# Patient Record
Sex: Male | Born: 1980 | Race: Black or African American | Hispanic: No | Marital: Single | Smoking: Current every day smoker
Health system: Southern US, Community
[De-identification: ages and names within clinical notes are randomized; demographics above are authoritative.]

## PROBLEM LIST (undated history)

## (undated) DIAGNOSIS — I517 Cardiomegaly: Secondary | ICD-10-CM

## (undated) DIAGNOSIS — I82409 Acute embolism and thrombosis of unspecified deep veins of unspecified lower extremity: Secondary | ICD-10-CM

## (undated) DIAGNOSIS — R4020 Unspecified coma: Secondary | ICD-10-CM

## (undated) HISTORY — DX: Unspecified coma: R40.20

---

## 2008-01-03 ENCOUNTER — Emergency Department (HOSPITAL_COMMUNITY): Admission: EM | Admit: 2008-01-03 | Discharge: 2008-01-03 | Payer: Self-pay | Admitting: Emergency Medicine

## 2009-01-08 ENCOUNTER — Emergency Department (HOSPITAL_COMMUNITY): Admission: EM | Admit: 2009-01-08 | Discharge: 2009-01-08 | Payer: Self-pay | Admitting: Emergency Medicine

## 2009-02-15 ENCOUNTER — Ambulatory Visit: Payer: Self-pay | Admitting: Cardiology

## 2009-02-21 ENCOUNTER — Ambulatory Visit: Payer: Self-pay | Admitting: Cardiology

## 2009-02-21 ENCOUNTER — Encounter: Payer: Self-pay | Admitting: Cardiology

## 2009-02-21 ENCOUNTER — Ambulatory Visit (HOSPITAL_COMMUNITY): Admission: RE | Admit: 2009-02-21 | Discharge: 2009-02-21 | Payer: Self-pay | Admitting: Cardiology

## 2009-07-09 ENCOUNTER — Emergency Department (HOSPITAL_COMMUNITY): Admission: EM | Admit: 2009-07-09 | Discharge: 2009-07-09 | Payer: Self-pay | Admitting: Emergency Medicine

## 2011-02-14 LAB — CBC
Hemoglobin: 13.8 g/dL (ref 13.0–17.0)
RDW: 12.2 % (ref 11.5–15.5)

## 2011-02-14 LAB — BASIC METABOLIC PANEL
Calcium: 9.8 mg/dL (ref 8.4–10.5)
GFR calc Af Amer: >60 mL/min (ref >60)
GFR calc non Af Amer: >60 mL/min (ref >60)
Glucose, Bld: 98 mg/dL (ref 70–99)
Sodium: 139 mEq/L (ref 135–145)

## 2011-02-14 LAB — DIFFERENTIAL
Basophils Absolute: 0 10*3/uL (ref 0.0–0.1)
Lymphocytes Relative: 11 % — ABNORMAL LOW (ref 12–46)
Monocytes Absolute: 0.9 10*3/uL (ref 0.1–1.0)
Neutro Abs: 7 10*3/uL (ref 1.7–7.7)
Neutrophils Relative %: 77 % (ref 43–77)

## 2011-02-14 LAB — D-DIMER, QUANTITATIVE: D-Dimer, Quant: 0.22 ug/mL-FEU (ref 0.00–0.48)

## 2011-02-14 LAB — POCT CARDIAC MARKERS
CKMB, poc: <1 ng/mL — ABNORMAL LOW (ref 1.0–8.0)
Myoglobin, poc: 61.4 ng/mL (ref 12–200)
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
## 2011-03-19 NOTE — Procedures (Signed)
[facility]                              EXERCISE TREADMILL   NAME:DAVISDewel, Lotter                          MRN:          [id number]  DATE:02/21/2009                            DOB:          [birth year]    Graded exercise test.   CLINICAL DATA:  A 30 year old gentleman admitted to hospital with chest  pain in the setting of cocaine use.  1. Graded exercise performed to a workload of 8.5 METS and a heart      rate of 164, 85% of age-predicted maximum.  Exercise discontinued      due to dyspnea and fatigue; no chest pain reported.  2. Blood pressure increased from a resting value of 130/88 to 180/90      early in recovery, a normal response.  3. No arrhythmias noted.  4. EKG:  Normal sinus rhythm; borderline left atrial abnormality;      right bundle-branch block.  5. Stress EKG:  No significant change.   IMPRESSION:  Negative graded exercise test.  Mr. Urwin achieved an  adequate work load and heart rate, but had neither symptoms nor  electrocardiographic abnormalities suggesting myocardial ischemia.  Other findings as noted.     Gerrit Friends. Dietrich Pates, MD, [facility]  Electronically Signed    RMR/MedQ  DD: 02/21/2009  DT: 02/22/2009  Job #: [id number]

## 2011-10-21 ENCOUNTER — Encounter: Payer: Self-pay | Admitting: Cardiology

## 2013-10-12 ENCOUNTER — Emergency Department (HOSPITAL_COMMUNITY)
Admission: EM | Admit: 2013-10-12 | Discharge: 2013-10-12 | Disposition: A | Discharge status: Home / Self Care | Payer: PRIVATE HEALTH INSURANCE | Attending: Emergency Medicine | Admitting: Emergency Medicine

## 2013-10-12 ENCOUNTER — Encounter (HOSPITAL_COMMUNITY): Payer: Self-pay | Admitting: Emergency Medicine

## 2013-10-12 ENCOUNTER — Emergency Department (HOSPITAL_COMMUNITY): Payer: PRIVATE HEALTH INSURANCE

## 2013-10-12 DIAGNOSIS — Z8679 Personal history of other diseases of the circulatory system: Secondary | ICD-10-CM | POA: Insufficient documentation

## 2013-10-12 DIAGNOSIS — R0789 Other chest pain: Secondary | ICD-10-CM | POA: Insufficient documentation

## 2013-10-12 DIAGNOSIS — R079 Chest pain, unspecified: Secondary | ICD-10-CM

## 2013-10-12 DIAGNOSIS — F17200 Nicotine dependence, unspecified, uncomplicated: Secondary | ICD-10-CM | POA: Insufficient documentation

## 2013-10-12 DIAGNOSIS — Z7901 Long term (current) use of anticoagulants: Secondary | ICD-10-CM | POA: Insufficient documentation

## 2013-10-12 DIAGNOSIS — Z86718 Personal history of other venous thrombosis and embolism: Secondary | ICD-10-CM | POA: Insufficient documentation

## 2013-10-12 DIAGNOSIS — Z791 Long term (current) use of non-steroidal anti-inflammatories (NSAID): Secondary | ICD-10-CM | POA: Insufficient documentation

## 2013-10-12 DIAGNOSIS — Z86711 Personal history of pulmonary embolism: Secondary | ICD-10-CM | POA: Insufficient documentation

## 2013-10-12 HISTORY — DX: Cardiomegaly: I51.7

## 2013-10-12 HISTORY — DX: Acute embolism and thrombosis of unspecified deep veins of unspecified lower extremity: I82.409

## 2013-10-12 LAB — POCT I-STAT, CHEM 8
Calcium, Ion: 1.26 mmol/L — ABNORMAL HIGH (ref 1.12–1.23)
HCT: 40 % (ref 39.0–52.0)
Hemoglobin: 13.6 g/dL (ref 13.0–17.0)
Sodium: 145 mEq/L (ref 135–145)
TCO2: 25 mmol/L (ref 0–100)

## 2013-10-12 LAB — CBC
MCH: 31.5 pg (ref 26.0–34.0)
MCHC: 34.1 g/dL (ref 30.0–36.0)
Platelets: 206 10*3/uL (ref 150–400)
RBC: 4.1 MIL/uL — ABNORMAL LOW (ref 4.22–5.81)
RDW: 12.4 % (ref 11.5–15.5)

## 2013-10-12 MED ORDER — NAPROXEN 500 MG PO TABS: 500.0000 mg | ORAL_TABLET | Freq: Two times a day (BID) | ORAL | Status: DC

## 2013-10-12 MED ORDER — IOHEXOL 350 MG/ML SOLN
100.0000 mL | Freq: Once | INTRAVENOUS | Status: AC | PRN
  Administered 2013-10-12: 100 mL via INTRAVENOUS
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
## 2013-10-12 NOTE — ED Notes (Signed)
Pt c/o left chest pain intermittently since last Tuesday.

## 2013-11-04 ENCOUNTER — Emergency Department (HOSPITAL_COMMUNITY)
Admission: EM | Admit: 2013-11-04 | Discharge: 2013-11-05 | Disposition: A | Discharge status: Home / Self Care | Payer: No Typology Code available for payment source | Attending: Emergency Medicine | Admitting: Emergency Medicine

## 2013-11-04 ENCOUNTER — Encounter (HOSPITAL_COMMUNITY): Payer: Self-pay | Admitting: Emergency Medicine

## 2013-11-04 DIAGNOSIS — Z7902 Long term (current) use of antithrombotics/antiplatelets: Secondary | ICD-10-CM | POA: Insufficient documentation

## 2013-11-04 DIAGNOSIS — M7989 Other specified soft tissue disorders: Secondary | ICD-10-CM | POA: Insufficient documentation

## 2013-11-04 DIAGNOSIS — Z791 Long term (current) use of non-steroidal anti-inflammatories (NSAID): Secondary | ICD-10-CM | POA: Insufficient documentation

## 2013-11-04 DIAGNOSIS — Z8679 Personal history of other diseases of the circulatory system: Secondary | ICD-10-CM | POA: Insufficient documentation

## 2013-11-04 DIAGNOSIS — Z79899 Other long term (current) drug therapy: Secondary | ICD-10-CM | POA: Insufficient documentation

## 2013-11-04 DIAGNOSIS — Z86718 Personal history of other venous thrombosis and embolism: Secondary | ICD-10-CM | POA: Insufficient documentation

## 2013-11-04 DIAGNOSIS — Z87828 Personal history of other (healed) physical injury and trauma: Secondary | ICD-10-CM | POA: Insufficient documentation

## 2013-11-04 DIAGNOSIS — F17200 Nicotine dependence, unspecified, uncomplicated: Secondary | ICD-10-CM | POA: Insufficient documentation

## 2013-11-04 MED ORDER — KETOROLAC TROMETHAMINE 60 MG/2ML IM SOLN
60.0000 mg | Freq: Once | INTRAMUSCULAR | Status: AC
  Administered 2013-11-04: 60 mg via INTRAMUSCULAR
  Filled 2013-11-04: qty 2

## 2013-11-04 NOTE — Discharge Instructions (Signed)
Continue to take the Eliquis for the blood clots - if your ultrasound shows that you still have blood clots, the nurse at the [facility] will need to change you to a different blood thinner such as coumadin.  Return to the ER immediately for severe chest pain or difficulty breathing.  Please call your doctor for a followup appointment within 24-48 hours. When you talk to your doctor please let them know that you were seen in the emergency department and have them acquire all of your records so that they can discuss the findings with you and formulate a treatment plan to fully care for your new and ongoing problems.
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
## 2013-11-04 NOTE — ED Notes (Signed)
Pt states he is on Eliquis for dvt of left leg, states he has been having pain in the left lower leg for some time, worse recently.  Pt denies new injury. Obvious swelling noted to outer calf of lower leg.

## 2013-11-05 ENCOUNTER — Other Ambulatory Visit (HOSPITAL_COMMUNITY): Payer: Self-pay | Admitting: Emergency Medicine

## 2013-11-05 ENCOUNTER — Ambulatory Visit (HOSPITAL_COMMUNITY)
Admit: 2013-11-05 | Discharge: 2013-11-05 | Disposition: A | Discharge status: Home / Self Care | Payer: No Typology Code available for payment source | Source: Ambulatory Visit | Attending: Emergency Medicine | Admitting: Emergency Medicine

## 2013-11-05 DIAGNOSIS — M79605 Pain in left leg: Secondary | ICD-10-CM

## 2013-11-05 DIAGNOSIS — M7989 Other specified soft tissue disorders: Secondary | ICD-10-CM | POA: Insufficient documentation

## 2013-11-05 DIAGNOSIS — I824Y9 Acute embolism and thrombosis of unspecified deep veins of unspecified proximal lower extremity: Secondary | ICD-10-CM | POA: Insufficient documentation

## 2013-11-05 NOTE — ED Provider Notes (Signed)
Doppler study of left lower extremity reveals a nonocclusive clot in the femoral vein. Patient has had a persistent left lower extremity DVT since 1999.  Continue Eliquis 5 mg po bid.  [facility] with [facility].  Donnetta HutchingBrian Darby Shadwick, MD 11/05/13 [phone]

## 2015-06-13 ENCOUNTER — Encounter (HOSPITAL_COMMUNITY): Payer: Self-pay | Admitting: *Deleted

## 2015-06-13 ENCOUNTER — Emergency Department (HOSPITAL_COMMUNITY)
Admission: EM | Admit: 2015-06-13 | Discharge: 2015-06-13 | Disposition: A | Discharge status: Home / Self Care | Payer: Medicaid Other | Attending: Emergency Medicine | Admitting: Emergency Medicine

## 2015-06-13 DIAGNOSIS — Z720 Tobacco use: Secondary | ICD-10-CM | POA: Diagnosis not present

## 2015-06-13 DIAGNOSIS — Z79899 Other long term (current) drug therapy: Secondary | ICD-10-CM | POA: Insufficient documentation

## 2015-06-13 DIAGNOSIS — Z8679 Personal history of other diseases of the circulatory system: Secondary | ICD-10-CM | POA: Insufficient documentation

## 2015-06-13 DIAGNOSIS — Z791 Long term (current) use of non-steroidal anti-inflammatories (NSAID): Secondary | ICD-10-CM | POA: Insufficient documentation

## 2015-06-13 DIAGNOSIS — M62830 Muscle spasm of back: Secondary | ICD-10-CM | POA: Insufficient documentation

## 2015-06-13 DIAGNOSIS — M5440 Lumbago with sciatica, unspecified side: Secondary | ICD-10-CM | POA: Insufficient documentation

## 2015-06-13 DIAGNOSIS — Z86718 Personal history of other venous thrombosis and embolism: Secondary | ICD-10-CM | POA: Insufficient documentation

## 2015-06-13 DIAGNOSIS — M545 Low back pain: Secondary | ICD-10-CM | POA: Diagnosis present

## 2015-06-13 MED ORDER — METHOCARBAMOL 500 MG PO TABS: 500.0000 mg | ORAL_TABLET | Freq: Three times a day (TID) | ORAL | Status: DC

## 2015-06-13 MED ORDER — ACETAMINOPHEN-CODEINE #3 300-30 MG PO TABS: 1.0000 | ORAL_TABLET | Freq: Four times a day (QID) | ORAL | Status: DC | PRN

## 2015-06-13 MED ORDER — DEXAMETHASONE 4 MG PO TABS: 4.0000 mg | ORAL_TABLET | Freq: Two times a day (BID) | ORAL | Status: DC

## 2015-06-13 NOTE — Discharge Instructions (Signed)
Please rest your back is much as possible. Please alternate heat and ice to your lower back. Use Decadron and Robaxin daily. Use Tylenol codeine for more severe pain. Robaxin and Tylenol codeine may cause drowsiness, please use these medicines with caution. Please see Dr. August Saucer for evaluation concerning your lower back pain. Back Pain, Adult Back pain is very common. The pain often gets better over time. The cause of back pain is usually not dangerous. Most people can learn to manage their back pain on their own.  HOME CARE   Stay active. Start with short walks on flat ground if you can. Try to walk farther each day.  Do not sit, drive, or stand in one place for more than 30 minutes. Do not stay in bed.  Do not avoid exercise or work. Activity can help your back heal faster.  Be careful when you bend or lift an object. Bend at your knees, keep the object close to you, and do not twist.  Sleep on a firm mattress. Lie on your side, and bend your knees. If you lie on your back, put a pillow under your knees.  Only take medicines as told by your doctor.  Put ice on the injured area.  Put ice in a plastic bag.  Place a towel between your skin and the bag.  Leave the ice on for 15-20 minutes, 03-04 times a day for the first 2 to 3 days. After that, you can switch between ice and heat packs.  Ask your doctor about back exercises or massage.  Avoid feeling anxious or stressed. Find good ways to deal with stress, such as exercise. GET HELP RIGHT AWAY IF:   Your pain does not go away with rest or medicine.  Your pain does not go away in 1 week.  You have new problems.  You do not feel well.  The pain spreads into your legs.  You cannot control when you poop (bowel movement) or pee (urinate).  Your arms or legs feel weak or lose feeling (numbness).  You feel sick to your stomach (nauseous) or throw up (vomit).  You have belly (abdominal) pain.  You feel like you may pass out  (faint). MAKE SURE YOU:   Understand these instructions.  Will watch your condition.  Will get help right away if you are not doing well or get worse. Document Released: 04/08/2008 Document Revised: 01/13/2012 Document Reviewed: 02/22/2014 [facility] Patient Information 2015 [place], [place]. This information is not intended to replace advice given to you by your health care provider. Make sure you discuss any questions you have with your health care provider.
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
## 2015-06-13 NOTE — ED Notes (Signed)
Patient reports moving a dresser about 1 month ago and felt a "pop" in back. Reports pain in low back that radiates down right leg.

## 2015-07-25 ENCOUNTER — Other Ambulatory Visit: Payer: Self-pay | Admitting: Sports Medicine

## 2015-07-25 DIAGNOSIS — M545 Low back pain: Secondary | ICD-10-CM

## 2015-07-26 ENCOUNTER — Other Ambulatory Visit: Payer: Self-pay | Admitting: Sports Medicine

## 2015-07-26 DIAGNOSIS — Z139 Encounter for screening, unspecified: Secondary | ICD-10-CM

## 2015-08-07 ENCOUNTER — Ambulatory Visit
Admission: RE | Admit: 2015-08-07 | Discharge: 2015-08-07 | Disposition: A | Discharge status: Home / Self Care | Payer: Medicaid Other | Source: Ambulatory Visit | Attending: Sports Medicine | Admitting: Sports Medicine

## 2015-08-07 ENCOUNTER — Other Ambulatory Visit: Payer: PRIVATE HEALTH INSURANCE

## 2015-08-07 DIAGNOSIS — Z139 Encounter for screening, unspecified: Secondary | ICD-10-CM

## 2015-08-07 DIAGNOSIS — M545 Low back pain: Secondary | ICD-10-CM

## 2016-11-27 IMAGING — CR DG ORBITS FOR FOREIGN BODY
2 series · 2 of 2 positions shown · non-contrast
Comparison: None.

CLINICAL DATA: Metal working/exposure; clearance prior to MRI

EXAM:
ORBITS FOR FOREIGN BODY - 2 VIEW

[w orbit pa (1 of 2)]
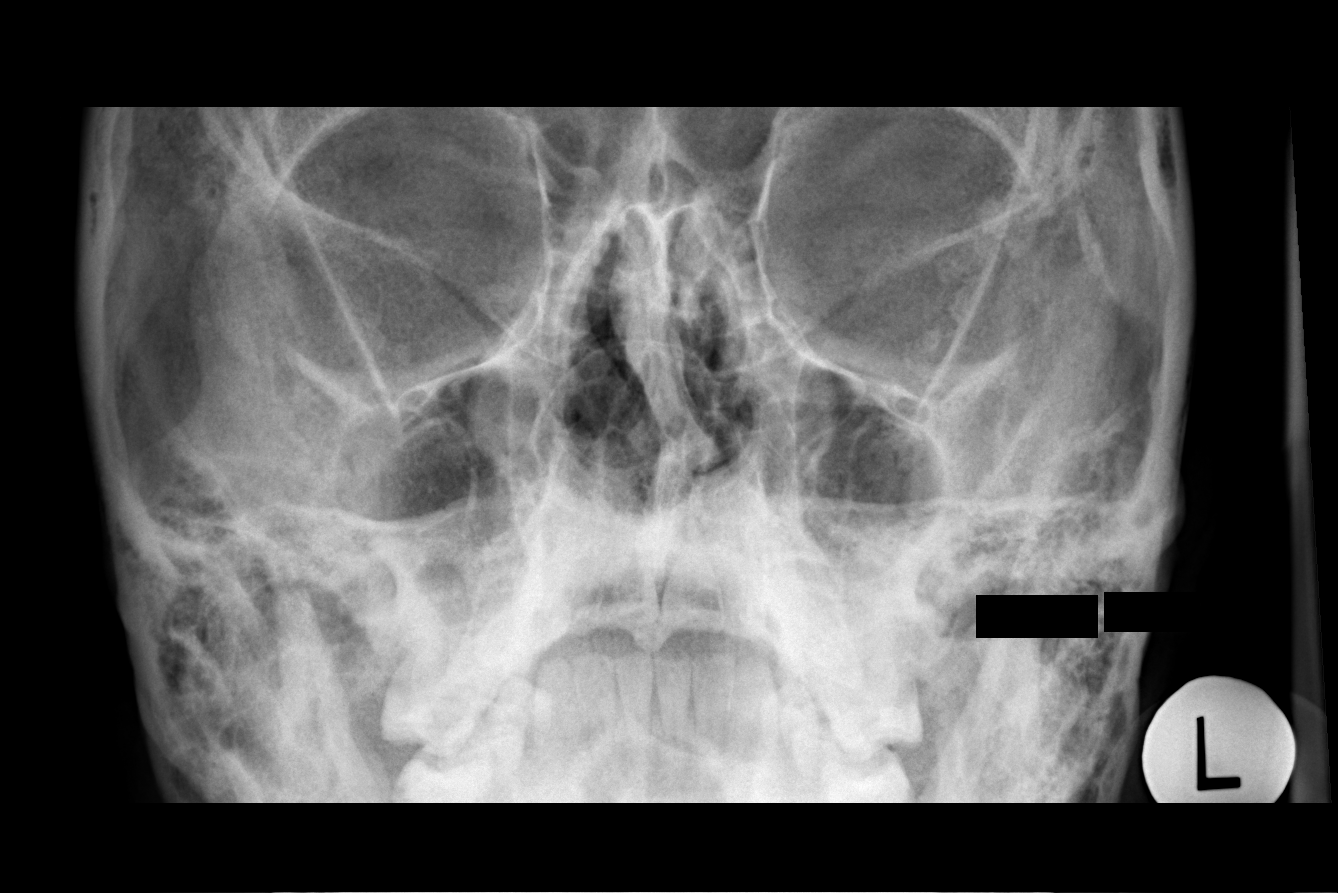

[w orbit pa (2 of 2)]
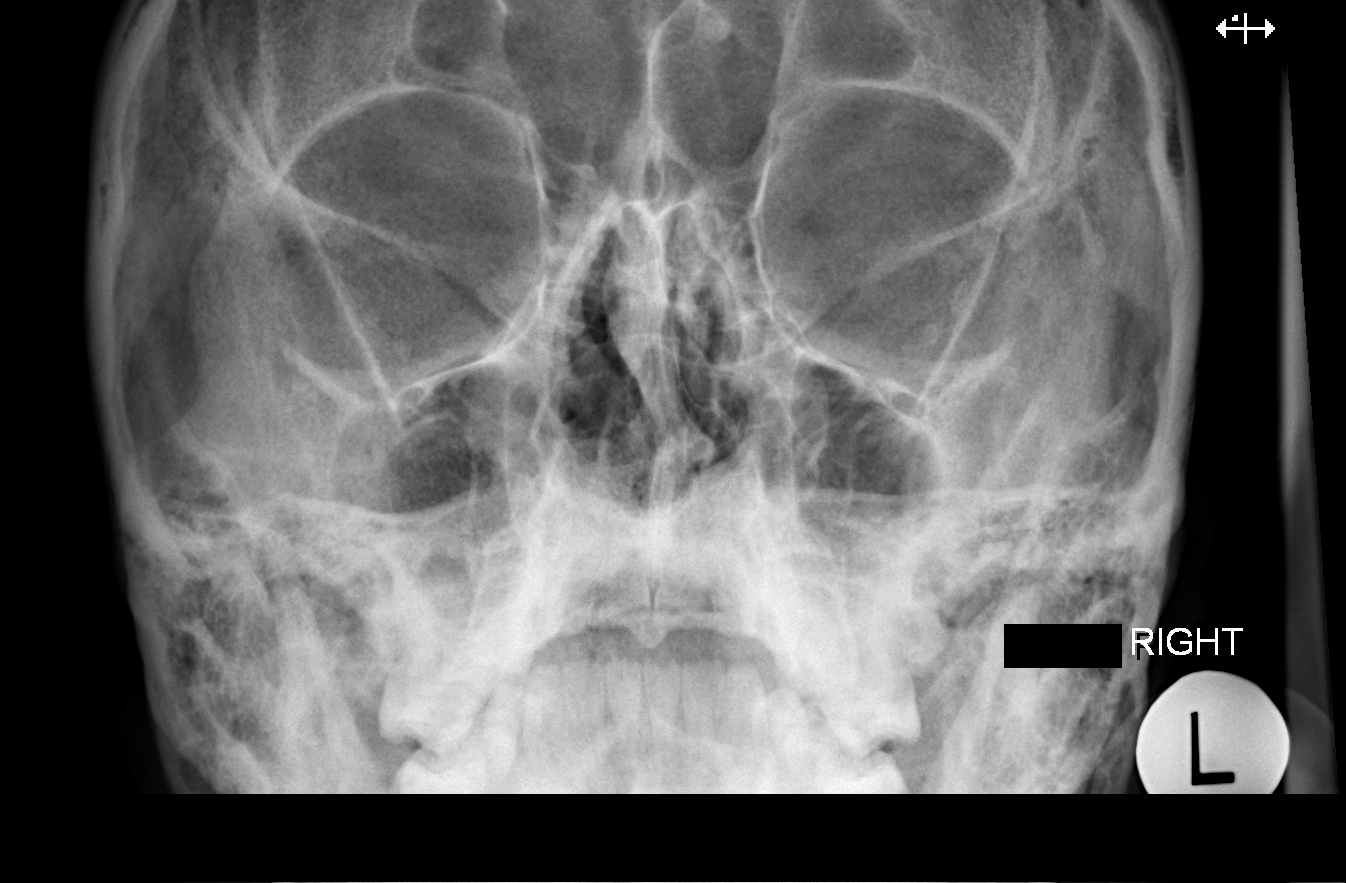

[2 of 2 positions shown; findings below may reference images not displayed]

FINDINGS: There is no evidence of metallic foreign body within the orbits. No
significant bone abnormality identified.
IMPRESSION: No evidence of metallic foreign body within the orbits.

## 2017-01-09 ENCOUNTER — Ambulatory Visit (INDEPENDENT_AMBULATORY_CARE_PROVIDER_SITE_OTHER): Payer: Self-pay | Admitting: Vascular Surgery

## 2017-01-09 ENCOUNTER — Other Ambulatory Visit (INDEPENDENT_AMBULATORY_CARE_PROVIDER_SITE_OTHER): Payer: Self-pay | Admitting: Vascular Surgery

## 2017-01-09 ENCOUNTER — Encounter (INDEPENDENT_AMBULATORY_CARE_PROVIDER_SITE_OTHER): Payer: Medicaid Other

## 2017-01-09 DIAGNOSIS — I87099 Postthrombotic syndrome with other complications of unspecified lower extremity: Secondary | ICD-10-CM

## 2019-12-20 ENCOUNTER — Other Ambulatory Visit: Payer: Self-pay

## 2019-12-20 ENCOUNTER — Ambulatory Visit: Payer: Medicaid Other | Attending: Internal Medicine

## 2019-12-20 DIAGNOSIS — Z20822 Contact with and (suspected) exposure to covid-19: Secondary | ICD-10-CM

## 2019-12-22 LAB — NOVEL CORONAVIRUS, NAA: SARS-CoV-2, NAA: NOT DETECTED

## 2022-06-22 ENCOUNTER — Encounter (HOSPITAL_COMMUNITY): Payer: Self-pay | Admitting: *Deleted

## 2022-06-22 ENCOUNTER — Other Ambulatory Visit: Payer: Self-pay

## 2022-06-22 ENCOUNTER — Emergency Department (HOSPITAL_COMMUNITY): Payer: Medicaid Other

## 2022-06-22 ENCOUNTER — Emergency Department (HOSPITAL_COMMUNITY)
Admission: EM | Admit: 2022-06-22 | Discharge: 2022-06-23 | Disposition: A | Discharge status: Home / Self Care | Payer: Medicaid Other | Attending: Student | Admitting: Student

## 2022-06-22 DIAGNOSIS — S52502A Unspecified fracture of the lower end of left radius, initial encounter for closed fracture: Secondary | ICD-10-CM | POA: Diagnosis not present

## 2022-06-22 DIAGNOSIS — W228XXA Striking against or struck by other objects, initial encounter: Secondary | ICD-10-CM | POA: Diagnosis not present

## 2022-06-22 DIAGNOSIS — Z7901 Long term (current) use of anticoagulants: Secondary | ICD-10-CM | POA: Diagnosis not present

## 2022-06-22 DIAGNOSIS — F17210 Nicotine dependence, cigarettes, uncomplicated: Secondary | ICD-10-CM | POA: Insufficient documentation

## 2022-06-22 DIAGNOSIS — S52602A Unspecified fracture of lower end of left ulna, initial encounter for closed fracture: Secondary | ICD-10-CM | POA: Diagnosis not present

## 2022-06-22 DIAGNOSIS — S6992XA Unspecified injury of left wrist, hand and finger(s), initial encounter: Secondary | ICD-10-CM | POA: Diagnosis present

## 2022-06-22 DIAGNOSIS — S5292XA Unspecified fracture of left forearm, initial encounter for closed fracture: Secondary | ICD-10-CM

## 2022-06-22 LAB — CBC WITH DIFFERENTIAL/PLATELET
Abs Immature Granulocytes: 0.02 10*3/uL (ref 0.00–0.07)
Basophils Absolute: 0 10*3/uL (ref 0.0–0.1)
Basophils Relative: 0 %
Eosinophils Absolute: 0.2 10*3/uL (ref 0.0–0.5)
Eosinophils Relative: 2 %
HCT: 42.5 % (ref 39.0–52.0)
Hemoglobin: 13.7 g/dL (ref 13.0–17.0)
Immature Granulocytes: 0 %
Lymphocytes Relative: 17 %
Lymphs Abs: 1.5 10*3/uL (ref 0.7–4.0)
MCH: 31.1 pg (ref 26.0–34.0)
MCHC: 32.2 g/dL (ref 30.0–36.0)
MCV: 96.4 fL (ref 80.0–100.0)
Monocytes Absolute: 0.8 10*3/uL (ref 0.1–1.0)
Monocytes Relative: 9 %
Neutro Abs: 6.6 10*3/uL (ref 1.7–7.7)
Neutrophils Relative %: 72 %
Platelets: 239 10*3/uL (ref 150–400)
RBC: 4.41 MIL/uL (ref 4.22–5.81)
RDW: 13 % (ref 11.5–15.5)
WBC: 9.2 10*3/uL (ref 4.0–10.5)
nRBC: 0 % (ref 0.0–0.2)

## 2022-06-22 LAB — COMPREHENSIVE METABOLIC PANEL
ALT: 32 U/L (ref 0–44)
AST: 31 U/L (ref 15–41)
Albumin: 4.6 g/dL (ref 3.5–5.0)
Alkaline Phosphatase: 48 U/L (ref 38–126)
Anion gap: 11 (ref 5–15)
BUN: 16 mg/dL (ref 6–20)
CO2: 20 mmol/L — ABNORMAL LOW (ref 22–32)
Calcium: 9.8 mg/dL (ref 8.9–10.3)
Chloride: 110 mmol/L (ref 98–111)
Creatinine, Ser: 1.14 mg/dL (ref 0.61–1.24)
GFR, Estimated: >60 mL/min (ref >60)
Glucose, Bld: 122 mg/dL — ABNORMAL HIGH (ref 70–99)
Potassium: 3.4 mmol/L — ABNORMAL LOW (ref 3.5–5.1)
Sodium: 141 mmol/L (ref 135–145)
Total Bilirubin: 0.5 mg/dL (ref 0.3–1.2)
Total Protein: 7.3 g/dL (ref 6.5–8.1)

## 2022-06-22 MED ORDER — FENTANYL CITRATE PF 50 MCG/ML IJ SOSY
100.0000 ug | PREFILLED_SYRINGE | Freq: Once | INTRAMUSCULAR | Status: AC
  Administered 2022-06-22: 100 ug via INTRAVENOUS
  Filled 2022-06-22: qty 2

## 2022-06-22 MED ORDER — HYDROCODONE-ACETAMINOPHEN 5-325 MG PO TABS: 2.0000 | ORAL_TABLET | ORAL | 0 refills | Status: DC | PRN

## 2022-06-22 MED ORDER — HYDROMORPHONE HCL 1 MG/ML IJ SOLN
1.0000 mg | Freq: Once | INTRAMUSCULAR | Status: AC
  Administered 2022-06-22: 1 mg via INTRAVENOUS
  Filled 2022-06-22: qty 1

## 2022-06-22 NOTE — ED Notes (Signed)
Family at  the bedside  the pt admits to using  cocaine alcohol and pot today
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
## 2022-06-23 ENCOUNTER — Emergency Department (HOSPITAL_COMMUNITY): Payer: Medicaid Other
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
## 2022-06-23 NOTE — Progress Notes (Signed)
Orthopedic Tech Progress Note Patient Details:  Russell Bell [birth year] [id number]  Ortho Devices Type of Ortho Device: Finger trap, Arm sling, Sugartong splint Finger Trap Weight: 15 lbs Ortho Device/Splint Location: lue Ortho Device/Splint Interventions: Ordered, Application, Adjustment  I applied 15 lbs fingertraps at drs request. The I applied a plaster sugartong. Post Interventions Patient Tolerated: Well Instructions Provided: Care of device, Adjustment of device  [name] 06/23/2022, 12:34 AM

## 2022-06-24 ENCOUNTER — Encounter (HOSPITAL_COMMUNITY): Payer: Self-pay | Admitting: Orthopedic Surgery

## 2022-06-24 ENCOUNTER — Other Ambulatory Visit: Payer: Self-pay

## 2022-06-24 NOTE — Progress Notes (Signed)
This Clinical research associate called patient with questions and instructions for the surgery day. Patient wasn't available and I spoke with patient's wife. Per wife, they will come tomorrow to the hospital to pick up the CHG surgical soap  PCP - pt doesn't know PCP name Cardiologist - denies  PPM/ICD - denies  Chest x-ray - n/a EKG - 06/22/22 Stress Test - denies ECHO - 02/21/09 Cardiac Cath - denies  CPAP - n/a  Fasting Blood Sugar - n/a  Blood Thinner Instructions: n/a Aspirin Instructions: Patient was instructed: As of today, STOP taking any Aspirin (unless otherwise instructed by your surgeon) Aleve, Naproxen, Ibuprofen, Motrin, Advil, Goody's, BC's, all herbal medications, fish oil, and all vitamins.  ERAS Protcol - yes, until 1100  COVID TEST- n/a  Anesthesia review: yes - history of Cardiomegaly, Come, DVT  Patient verbally denies any shortness of breath, fever, cough and chest pain during phone call   -------------  SDW INSTRUCTIONS given:  Your procedure is scheduled on Wednesday, August 23rd, 2023.  Report to [facility] Main Entrance "A" at 09:40 A.M., and check in at the Admitting office.  Call this number if you have problems the morning of surgery:  [phone]   Remember:  Do not eat after midnight the night before your surgery  You may drink clear liquids until 10:00 the morning of your surgery.   Clear liquids allowed are: Water, Non-Citrus Juices (without pulp), Carbonated Beverages, Clear Tea, Black Coffee Only, and Gatorade    Take these medicines the morning of surgery with A SIP OF WATER Hydrocodone - PRN  The day of surgery:                   Do not wear jewelry            Do not wear lotions, powders, colognes, or deodorant.            Men may shave face and neck.            Do not bring valuables to the hospital.            [facility] is not responsible for any belongings or valuables.  Do NOT Smoke (Tobacco/Vaping) 24 hours prior to your procedure If you  use a CPAP at night, you may bring all equipment for your overnight stay.   Contacts, glasses, dentures or bridgework may not be worn into surgery.      For patients admitted to the hospital, discharge time will be determined by your treatment team.   Patients discharged the day of surgery will not be allowed to drive home, and someone needs to stay with them for 24 hours.    Special instructions:   [facility]- Preparing For Surgery  Before surgery, you can play an important role. Because skin is not sterile, your skin needs to be as free of germs as possible. You can reduce the number of germs on your skin by washing with CHG (chlorahexidine gluconate) Soap before surgery.  CHG is an antiseptic cleaner which kills germs and bonds with the skin to continue killing germs even after washing.    Oral Hygiene is also important to reduce your risk of infection.  Remember - BRUSH YOUR TEETH THE MORNING OF SURGERY WITH YOUR REGULAR TOOTHPASTE  Please do not use if you have an allergy to CHG or antibacterial soaps. If your skin becomes reddened/irritated stop using the CHG.  Do not shave (including legs and underarms) for at least 48 hours  prior to first CHG shower. It is OK to shave your face.  Please follow these instructions carefully.   Shower the NIGHT BEFORE SURGERY and the MORNING OF SURGERY with DIAL Soap.   Pat yourself dry with a CLEAN TOWEL.  Wear CLEAN PAJAMAS to bed the night before surgery  Place CLEAN SHEETS on your bed the night of your first shower and DO NOT SLEEP WITH PETS.   Day of Surgery: Please shower morning of surgery  Wear Clean/Comfortable clothing the morning of surgery Do not apply any deodorants/lotions.   Remember to brush your teeth WITH YOUR REGULAR TOOTHPASTE.   Questions were answered. Patient verbalized understanding of instructions.

## 2022-06-25 ENCOUNTER — Encounter (HOSPITAL_COMMUNITY): Payer: Self-pay | Admitting: Orthopedic Surgery

## 2022-06-25 ENCOUNTER — Ambulatory Visit (INDEPENDENT_AMBULATORY_CARE_PROVIDER_SITE_OTHER): Payer: Medicaid Other | Admitting: Orthopedic Surgery

## 2022-06-25 DIAGNOSIS — S5292XA Unspecified fracture of left forearm, initial encounter for closed fracture: Secondary | ICD-10-CM | POA: Diagnosis not present
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
## 2022-06-26 ENCOUNTER — Ambulatory Visit (HOSPITAL_COMMUNITY)
Admission: RE | Admit: 2022-06-26 | Discharge: 2022-06-26 | Disposition: A | Discharge status: Home / Self Care | Payer: Medicaid Other | Attending: Orthopedic Surgery | Admitting: Orthopedic Surgery

## 2022-06-26 ENCOUNTER — Encounter (HOSPITAL_COMMUNITY)
Admission: RE | Disposition: A | Discharge status: Home / Self Care | Payer: Self-pay | Source: Home / Self Care | Attending: Orthopedic Surgery

## 2022-06-26 ENCOUNTER — Other Ambulatory Visit: Payer: Self-pay

## 2022-06-26 ENCOUNTER — Ambulatory Visit (HOSPITAL_COMMUNITY): Payer: Medicaid Other

## 2022-06-26 ENCOUNTER — Ambulatory Visit (HOSPITAL_COMMUNITY): Payer: Medicaid Other | Admitting: Physician Assistant

## 2022-06-26 ENCOUNTER — Ambulatory Visit (HOSPITAL_BASED_OUTPATIENT_CLINIC_OR_DEPARTMENT_OTHER): Payer: Medicaid Other | Admitting: Physician Assistant

## 2022-06-26 ENCOUNTER — Encounter (HOSPITAL_COMMUNITY): Payer: Self-pay | Admitting: Orthopedic Surgery

## 2022-06-26 DIAGNOSIS — Y93I9 Activity, other involving external motion: Secondary | ICD-10-CM | POA: Diagnosis not present

## 2022-06-26 DIAGNOSIS — S52202A Unspecified fracture of shaft of left ulna, initial encounter for closed fracture: Secondary | ICD-10-CM | POA: Diagnosis not present

## 2022-06-26 DIAGNOSIS — F17210 Nicotine dependence, cigarettes, uncomplicated: Secondary | ICD-10-CM | POA: Insufficient documentation

## 2022-06-26 DIAGNOSIS — S52601A Unspecified fracture of lower end of right ulna, initial encounter for closed fracture: Secondary | ICD-10-CM

## 2022-06-26 DIAGNOSIS — S52501A Unspecified fracture of the lower end of right radius, initial encounter for closed fracture: Secondary | ICD-10-CM | POA: Diagnosis not present

## 2022-06-26 DIAGNOSIS — S52502A Unspecified fracture of the lower end of left radius, initial encounter for closed fracture: Secondary | ICD-10-CM | POA: Diagnosis present

## 2022-06-26 HISTORY — PX: OPEN REDUCTION INTERNAL FIXATION (ORIF) DISTAL RADIAL FRACTURE: SHX5989

## 2022-06-26 SURGERY — OPEN REDUCTION INTERNAL FIXATION (ORIF) DISTAL RADIUS FRACTURE
Anesthesia: Regional | Site: Arm Lower | Laterality: Left

## 2022-06-26 MED ORDER — PROPOFOL 500 MG/50ML IV EMUL
INTRAVENOUS | Status: DC | PRN
  Administered 2022-06-26: 125 ug/kg/min via INTRAVENOUS

## 2022-06-26 MED ORDER — OXYCODONE HCL 5 MG PO TABS: 5.0000 mg | ORAL_TABLET | ORAL | 0 refills | Status: AC | PRN

## 2022-06-26 MED ORDER — CHLORHEXIDINE GLUCONATE 0.12 % MT SOLN
15.0000 mL | Freq: Once | OROMUCOSAL | Status: AC
Start: 2022-06-26 — End: 2022-06-26
  Administered 2022-06-26: 15 mL via OROMUCOSAL
  Filled 2022-06-26: qty 15

## 2022-06-26 MED ORDER — MIDAZOLAM HCL 2 MG/2ML IJ SOLN: 1.0000 mg | Freq: Once | INTRAMUSCULAR | Status: AC

## 2022-06-26 MED ORDER — CEFAZOLIN SODIUM-DEXTROSE 2-4 GM/100ML-% IV SOLN
2.0000 g | INTRAVENOUS | Status: AC
  Administered 2022-06-26: 2 g via INTRAVENOUS
  Filled 2022-06-26: qty 100

## 2022-06-26 MED ORDER — FENTANYL CITRATE (PF) 100 MCG/2ML IJ SOLN: 25.0000 ug | INTRAMUSCULAR | Status: DC | PRN

## 2022-06-26 MED ORDER — PROPOFOL 1000 MG/100ML IV EMUL
INTRAVENOUS | Status: AC
  Filled 2022-06-26: qty 100

## 2022-06-26 MED ORDER — ROPIVACAINE HCL 7.5 MG/ML IJ SOLN
INTRAMUSCULAR | Status: DC | PRN
  Administered 2022-06-26: 20 mL via PERINEURAL

## 2022-06-26 MED ORDER — PROPOFOL 10 MG/ML IV BOLUS
INTRAVENOUS | Status: AC
  Filled 2022-06-26: qty 20

## 2022-06-26 MED ORDER — FENTANYL CITRATE (PF) 100 MCG/2ML IJ SOLN
INTRAMUSCULAR | Status: AC
  Administered 2022-06-26: 50 ug via INTRAVENOUS
  Filled 2022-06-26: qty 2

## 2022-06-26 MED ORDER — MEPERIDINE HCL 25 MG/ML IJ SOLN: 6.2500 mg | INTRAMUSCULAR | Status: DC | PRN

## 2022-06-26 MED ORDER — PHENYLEPHRINE 80 MCG/ML (10ML) SYRINGE FOR IV PUSH (FOR BLOOD PRESSURE SUPPORT)
PREFILLED_SYRINGE | INTRAVENOUS | Status: DC | PRN
  Administered 2022-06-26: 80 ug via INTRAVENOUS

## 2022-06-26 MED ORDER — 0.9 % SODIUM CHLORIDE (POUR BTL) OPTIME
TOPICAL | Status: DC | PRN
  Administered 2022-06-26: 1000 mL

## 2022-06-26 MED ORDER — LACTATED RINGERS IV SOLN: INTRAVENOUS | Status: DC

## 2022-06-26 MED ORDER — FENTANYL CITRATE (PF) 250 MCG/5ML IJ SOLN
INTRAMUSCULAR | Status: AC
  Filled 2022-06-26: qty 5

## 2022-06-26 MED ORDER — MIDAZOLAM HCL 2 MG/2ML IJ SOLN
INTRAMUSCULAR | Status: AC
  Filled 2022-06-26: qty 2

## 2022-06-26 MED ORDER — ONDANSETRON HCL 4 MG/2ML IJ SOLN
INTRAMUSCULAR | Status: DC | PRN
  Administered 2022-06-26: 4 mg via INTRAVENOUS

## 2022-06-26 MED ORDER — FENTANYL CITRATE (PF) 100 MCG/2ML IJ SOLN: 50.0000 ug | Freq: Once | INTRAMUSCULAR | Status: AC

## 2022-06-26 MED ORDER — MIDAZOLAM HCL 2 MG/2ML IJ SOLN
INTRAMUSCULAR | Status: DC | PRN
  Administered 2022-06-26: 2 mg via INTRAVENOUS

## 2022-06-26 MED ORDER — ORAL CARE MOUTH RINSE: 15.0000 mL | Freq: Once | OROMUCOSAL | Status: AC

## 2022-06-26 MED ORDER — ONDANSETRON HCL 4 MG/2ML IJ SOLN
INTRAMUSCULAR | Status: AC
  Filled 2022-06-26: qty 2

## 2022-06-26 MED ORDER — BUPIVACAINE HCL (PF) 0.25 % IJ SOLN
INTRAMUSCULAR | Status: DC | PRN
  Administered 2022-06-26: 30 mL

## 2022-06-26 MED ORDER — PROMETHAZINE HCL 25 MG/ML IJ SOLN: 6.2500 mg | INTRAMUSCULAR | Status: DC | PRN

## 2022-06-26 MED ORDER — DEXMEDETOMIDINE (PRECEDEX) IN NS 20 MCG/5ML (4 MCG/ML) IV SYRINGE
PREFILLED_SYRINGE | INTRAVENOUS | Status: DC | PRN
  Administered 2022-06-26: 8 ug via INTRAVENOUS

## 2022-06-26 MED ORDER — ROPIVACAINE HCL 5 MG/ML IJ SOLN
INTRAMUSCULAR | Status: DC | PRN
  Administered 2022-06-26: 15 mL via PERINEURAL

## 2022-06-26 MED ORDER — DEXAMETHASONE SODIUM PHOSPHATE 4 MG/ML IJ SOLN
INTRAMUSCULAR | Status: DC | PRN
  Administered 2022-06-26: 5 mg via PERINEURAL

## 2022-06-26 MED ORDER — BUPIVACAINE HCL (PF) 0.25 % IJ SOLN
INTRAMUSCULAR | Status: AC
  Filled 2022-06-26: qty 30

## 2022-06-26 MED ORDER — CLONIDINE HCL (ANALGESIA) 100 MCG/ML EP SOLN
EPIDURAL | Status: DC | PRN
  Administered 2022-06-26: 80 ug

## 2022-06-26 MED ORDER — MIDAZOLAM HCL 2 MG/2ML IJ SOLN
INTRAMUSCULAR | Status: AC
  Administered 2022-06-26: 1 mg via INTRAVENOUS
  Filled 2022-06-26: qty 2

## 2022-06-26 SURGICAL SUPPLY — 61 items
BAG COUNTER SPONGE SURGICOUNT (BAG) ×1 IMPLANT
BIT DRILL 2.2 SS TIBIAL (BIT) IMPLANT
BIT DRILL 2.5X2.75 QC CALB (BIT) IMPLANT
BIT DRILL 2.7X100 LONG (BIT) IMPLANT
BIT DRILL STD 2.0MM (DRILL) IMPLANT
BNDG ELASTIC 4X5.8 VLCR STR LF (GAUZE/BANDAGES/DRESSINGS) IMPLANT
BNDG ESMARK 4X9 LF (GAUZE/BANDAGES/DRESSINGS) ×1 IMPLANT
BNDG GAUZE DERMACEA FLUFF (GAUZE/BANDAGES/DRESSINGS) ×1
BNDG GAUZE DERMACEA FLUFF 4 (GAUZE/BANDAGES/DRESSINGS) IMPLANT
CORD BIPOLAR FORCEPS 12FT (ELECTRODE) ×1 IMPLANT
COVER SURGICAL LIGHT HANDLE (MISCELLANEOUS) ×1 IMPLANT
CUFF TOURN SGL QUICK 18X4 (TOURNIQUET CUFF) ×1 IMPLANT
DRAPE OEC MINIVIEW 54X84 (DRAPES) IMPLANT
DRAPE SURG 17X23 STRL (DRAPES) ×1 IMPLANT
DRILL STANDARD 2.0MM (DRILL) ×1
DRIVER BIT SQUARE 1.7/2.2 (TRAUMA) IMPLANT
GAUZE 4X4 16PLY ~~LOC~~+RFID DBL (SPONGE) IMPLANT
GAUZE SPONGE 4X4 12PLY STRL (GAUZE/BANDAGES/DRESSINGS) ×1 IMPLANT
GAUZE XEROFORM 1X8 LF (GAUZE/BANDAGES/DRESSINGS) ×1 IMPLANT
GAUZE XEROFORM 5X9 LF (GAUZE/BANDAGES/DRESSINGS) IMPLANT
GLOVE BIOGEL M 8.0 STRL (GLOVE) ×1 IMPLANT
GLOVE SS BIOGEL STRL SZ 8 (GLOVE) ×1 IMPLANT
GLOVE SUPERSENSE BIOGEL SZ 8 (GLOVE) ×1
GOWN STRL REUS W/ TWL LRG LVL3 (GOWN DISPOSABLE) ×1 IMPLANT
GOWN STRL REUS W/ TWL XL LVL3 (GOWN DISPOSABLE) ×1 IMPLANT
GOWN STRL REUS W/TWL LRG LVL3 (GOWN DISPOSABLE) ×1
GOWN STRL REUS W/TWL XL LVL3 (GOWN DISPOSABLE) ×2
HYDROGEN PEROXIDE 16OZ (MISCELLANEOUS) IMPLANT
K-WIRE 1.6 (WIRE) ×5
K-WIRE FX5X1.6XNS BN SS (WIRE) ×5
KIT BASIN OR (CUSTOM PROCEDURE TRAY) ×1 IMPLANT
KIT TURNOVER KIT B (KITS) ×1 IMPLANT
KWIRE FX5X1.6XNS BN SS (WIRE) IMPLANT
NS IRRIG 1000ML POUR BTL (IV SOLUTION) ×1 IMPLANT
PACK ORTHO EXTREMITY (CUSTOM PROCEDURE TRAY) ×1 IMPLANT
PAD ARMBOARD 7.5X6 YLW CONV (MISCELLANEOUS) ×2 IMPLANT
PAD CAST 3X4 CTTN HI CHSV (CAST SUPPLIES) IMPLANT
PADDING CAST COTTON 3X4 STRL (CAST SUPPLIES) ×1
PLATE LOCK COMP 7H FOOT (Plate) IMPLANT
PLATE LOCK COMPR SFS 2.7X95 8H (Plate) IMPLANT
SCREW CORT 2.5X20X2.7XST SM (Screw) IMPLANT
SCREW CORTICAL 2.7X14MM (Screw) IMPLANT
SCREW CORTICAL 2.7X18MM (Screw) IMPLANT
SCREW CORTICAL 2.7X20MM (Screw) ×2 IMPLANT
SCREW CORTICAL 3.5MM  16MM (Screw) ×4 IMPLANT
SCREW CORTICAL 3.5MM 16MM (Screw) IMPLANT
SCREW CORTICAL 3.5MM 18MM (Screw) IMPLANT
SCREW CORTICAL 3.5MM 22MM (Screw) IMPLANT
SOL PREP POV-IOD 4OZ 10% (MISCELLANEOUS) ×1 IMPLANT
SOL SCRUB PVP POV-IOD 4OZ 7.5% (MISCELLANEOUS) ×1
SOLUTION SCRB POV-IOD 4OZ 7.5% (MISCELLANEOUS) ×1 IMPLANT
SPLINT FIBERGLASS 4X30 (CAST SUPPLIES) IMPLANT
SUCTION FRAZIER TIP 8 FR DISP (SUCTIONS) ×1
SUCTION TUBE FRAZIER 8FR DISP (SUCTIONS) IMPLANT
SUT ETHILON 3 0 PS 1 (SUTURE) IMPLANT
SUT ETHILON 4 0 PS 2 18 (SUTURE) IMPLANT
SUT VIC AB 3-0 SH 27 (SUTURE) ×3
SUT VIC AB 3-0 SH 27X BRD (SUTURE) IMPLANT
TOWEL GREEN STERILE (TOWEL DISPOSABLE) ×1 IMPLANT
TOWEL GREEN STERILE FF (TOWEL DISPOSABLE) ×1 IMPLANT
TUBE CONNECTING 12X1/4 (SUCTIONS) ×1 IMPLANT

## 2022-06-26 NOTE — Anesthesia Procedure Notes (Signed)
Anesthesia Regional Block: Axillary brachial plexus block   Pre-Anesthetic Checklist: , timeout performed,  Correct Patient, Correct Site, Correct Laterality,  Correct Procedure, Correct Position, site marked,  Risks and benefits discussed,  Surgical consent,  Pre-op evaluation,  At surgeon's request and post-op pain management  Laterality: Upper and Left  Prep: chloraprep       Needles:  Injection technique: Single-shot  Needle Type: Stimiplex          Additional Needles:   Procedures:,,,, ultrasound used (permanent image in chart),,    Narrative:  Start time: 06/26/2022 12:04 PM End time: 06/26/2022 12:24 PM Injection made incrementally with aspirations every 5 mL.  Performed by: Personally  Anesthesiologist: Lewie Loron, MD  Additional Notes: BP cuff, SpO2 and EKG monitors applied. Sedation begun. Nerve location verified with ultrasound. Anesthetic injected incrementally, slowly, and after neg aspirations under direct u/s guidance. Good perineural spread. Tolerated well.

## 2022-06-26 NOTE — Anesthesia Postprocedure Evaluation (Signed)
Anesthesia Post Note  Patient: Russell Bell  Procedure(s) Performed: LEFT OPEN REDUCTION INTERNAL FIXATION (ORIF) RADIUS AND ULNA (Left: Arm Lower)     Patient location during evaluation: PACU Anesthesia Type: Regional and MAC Level of consciousness: awake and alert Pain management: pain level controlled Vital Signs Assessment: post-procedure vital signs reviewed and stable Respiratory status: spontaneous breathing, nonlabored ventilation, respiratory function stable and patient connected to nasal cannula oxygen Cardiovascular status: stable and blood pressure returned to baseline Postop Assessment: no apparent nausea or vomiting Anesthetic complications: no   No notable events documented.  Last Vitals:  Vitals:   06/26/22 1830 06/26/22 1845  BP: (!) 159/73 (!) 157/90  Pulse: 68 70  Resp: 12 15  Temp:  36.6 C  SpO2: 96% 97%    Last Pain:  Vitals:   06/26/22 1845  TempSrc:   PainSc: 0-No pain                 Kesia Dalto

## 2022-06-26 NOTE — Transfer of Care (Signed)
Immediate Anesthesia Transfer of Care Note  Patient: Rodolfo Gaster  Procedure(s) Performed: LEFT OPEN REDUCTION INTERNAL FIXATION (ORIF) RADIUS AND ULNA (Left: Arm Lower)  Patient Location: PACU  Anesthesia Type:MAC  Level of Consciousness: drowsy  Airway & Oxygen Therapy: Patient Spontanous Breathing and Patient connected to nasal cannula oxygen  Post-op Assessment: Report given to RN and Post -op Vital signs reviewed and stable  Post vital signs: Reviewed and stable  Last Vitals:  Vitals Value Taken Time  BP    Temp    Pulse 76 06/26/22 1815  Resp 15 06/26/22 1815  SpO2 94 % 06/26/22 1815  Vitals shown include unvalidated device data.  Last Pain:  Vitals:   06/26/22 1013  TempSrc:   PainSc: 6       Patients Stated Pain Goal: 3 (49/32/41 9914)  Complications: No notable events documented.

## 2022-06-26 NOTE — Interval H&P Note (Signed)
History and Physical Interval Note:  06/26/2022 1:10 PM  Russell Bell  has presented today for surgery, with the diagnosis of LEFT BOTH BONE FOREARM FRACTURE.  The various methods of treatment have been discussed with the patient and family. After consideration of risks, benefits and other options for treatment, the patient has consented to  Procedure(s): LEFT OPEN REDUCTION INTERNAL FIXATION (ORIF) RADIUS AND ULNA (Left) as a surgical intervention.  The patient's history has been reviewed, patient examined, no change in status, stable for surgery.  I have reviewed the patient's chart and labs.  Questions were answered to the patient's satisfaction.     Russell Bell Kylin Dubs

## 2022-06-26 NOTE — Anesthesia Procedure Notes (Signed)
Procedure Name: MAC Date/Time: 06/26/2022 1:30 PM  Performed by: Inda Coke, CRNAPre-anesthesia Checklist: Patient identified, Emergency Drugs available, Suction available, Timeout performed and Patient being monitored Patient Re-evaluated:Patient Re-evaluated prior to induction Oxygen Delivery Method: Simple face mask Induction Type: IV induction Dental Injury: Teeth and Oropharynx as per pre-operative assessment
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
## 2022-06-26 NOTE — Anesthesia Preprocedure Evaluation (Addendum)
Anesthesia Evaluation  Patient identified by MRN, date of birth, ID band Patient awake    Reviewed: Allergy & Precautions, NPO status , Patient's Chart, lab work & pertinent test results  Airway Mallampati: I  TM Distance: >3 FB Neck ROM: Full    Dental  (+) Dental Advisory Given, Teeth Intact   Pulmonary Current Smoker and Patient abstained from smoking.,    Pulmonary exam normal breath sounds clear to auscultation       Cardiovascular hypertension, Normal cardiovascular exam Rhythm:Regular Rate:Normal     Neuro/Psych negative neurological ROS     GI/Hepatic negative GI ROS, Neg liver ROS,   Endo/Other  negative endocrine ROS  Renal/GU negative Renal ROS     Musculoskeletal negative musculoskeletal ROS (+)   Abdominal   Peds  Hematology negative hematology ROS (+)   Anesthesia Other Findings   Reproductive/Obstetrics                           Anesthesia Physical Anesthesia Plan  ASA: 2  Anesthesia Plan: Regional   Post-op Pain Management: Regional block* and Minimal or no pain anticipated   Induction: Intravenous  PONV Risk Score and Plan: 0 and Propofol infusion, TIVA, Treatment may vary due to age or medical condition and Midazolam  Airway Management Planned: Mask  Additional Equipment:   Intra-op Plan:   Post-operative Plan:   Informed Consent: I have reviewed the patients History and Physical, chart, labs and discussed the procedure including the risks, benefits and alternatives for the proposed anesthesia with the patient or authorized representative who has indicated his/her understanding and acceptance.     Dental advisory given  Plan Discussed with: CRNA  Anesthesia Plan Comments:        Anesthesia Quick Evaluation

## 2022-06-26 NOTE — Discharge Instructions (Signed)
  Amerie Beaumont, M.D. Hand Surgery  POST-OPERATIVE DISCHARGE INSTRUCTIONS   PRESCRIPTIONS: You may have been given a prescription to be taken as directed for post-operative pain control.  You may also take over the counter ibuprofen/aleve and tylenol for pain. Take this as directed on the packaging. Do not exceed 3000 mg tylenol/acetaminophen in 24 hours.  Ibuprofen 600-800 mg (3-4) tablets by mouth every 6 hours as needed for pain.  OR Aleve 2 tablets by mouth every 12 hours (twice daily) as needed for pain.  AND/OR Tylenol 1000 mg (2 tablets) every 8 hours as needed for pain.  Please use your pain medication carefully, as refills are limited and you may not be provided with one.  As stated above, please use over the counter pain medicine - it will also be helpful with decreasing your swelling.    ANESTHESIA: After your surgery, post-surgical discomfort or pain is likely. This discomfort can last several days to a few weeks. At certain times of the day your discomfort may be more intense.   Did you receive a nerve block?  A nerve block can provide pain relief for one hour to two days after your surgery. As long as the nerve block is working, you will experience little or no sensation in the area the surgeon operated on.  As the nerve block wears off, you will begin to experience pain or discomfort. It is very important that you begin taking your prescribed pain medication before the nerve block fully wears off. Treating your pain at the first sign of the block wearing off will ensure your pain is better controlled and more tolerable when full-sensation returns. Do not wait until the pain is intolerable, as the medicine will be less effective. It is better to treat pain in advance than to try and catch up.   General Anesthesia:  If you did not receive a nerve block during your surgery, you will need to start taking your pain medication shortly after your surgery and should continue  to do so as prescribed by your surgeon.     ICE AND ELEVATION: You may use ice for the first 48-72 hours, but it is not critical.   Motion of your fingers is very important to decrease the swelling.  Elevation, as much as possible for the next 48 hours, is critical for decreasing swelling as well as for pain relief. Elevation means when you are seated or lying down, you hand should be at or above your heart. When walking, the hand needs to be at or above the level of your elbow.  If the bandage gets too tight, it may need to be loosened. Please contact our office and we will instruct you in how to do this.    SURGICAL BANDAGES:  Keep your dressing and/or splint clean and dry at all times.  Do not remove until you are seen again in the office.  If careful, you may place a plastic bag over your bandage and tape the end to shower, but be careful, do not get your bandages wet.     HAND THERAPY:  You may not need any. If you do, we will begin this at your follow up visit in the clinic.    ACTIVITY AND WORK: You are encouraged to move any fingers which are not in the bandage.  Light use of the fingers is allowed to assist the other hand with daily hygiene and eating, but strong gripping or lifting is often uncomfortable and   should be avoided.  You might miss a variable period of time from work and hopefully this issue has been discussed prior to surgery. You may not do any heavy work with your affected hand for about 2 weeks.    [facility] [place] [address] [place],  [place]  [address] [phone]  

## 2022-06-27 ENCOUNTER — Encounter (HOSPITAL_COMMUNITY): Payer: Self-pay | Admitting: Orthopedic Surgery

## 2022-07-02 ENCOUNTER — Ambulatory Visit (INDEPENDENT_AMBULATORY_CARE_PROVIDER_SITE_OTHER): Payer: Medicaid Other | Admitting: Orthopedic Surgery

## 2022-07-02 ENCOUNTER — Ambulatory Visit (INDEPENDENT_AMBULATORY_CARE_PROVIDER_SITE_OTHER): Payer: Medicaid Other

## 2022-07-02 DIAGNOSIS — S5292XA Unspecified fracture of left forearm, initial encounter for closed fracture: Secondary | ICD-10-CM | POA: Diagnosis not present
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
## 2022-07-02 NOTE — Addendum Note (Signed)
Addended by: Marlyne Beards on: 07/02/2022 10:22 AM   Modules accepted: Orders

## 2022-07-10 ENCOUNTER — Encounter (HOSPITAL_COMMUNITY): Payer: Self-pay | Admitting: Occupational Therapy

## 2022-07-10 ENCOUNTER — Ambulatory Visit (HOSPITAL_COMMUNITY): Payer: Medicaid Other | Attending: Orthopedic Surgery | Admitting: Occupational Therapy

## 2022-07-10 DIAGNOSIS — M25632 Stiffness of left wrist, not elsewhere classified: Secondary | ICD-10-CM | POA: Diagnosis present

## 2022-07-10 DIAGNOSIS — R29898 Other symptoms and signs involving the musculoskeletal system: Secondary | ICD-10-CM | POA: Diagnosis present

## 2022-07-10 DIAGNOSIS — M79632 Pain in left forearm: Secondary | ICD-10-CM | POA: Diagnosis present
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
## 2022-07-10 NOTE — Patient Instructions (Addendum)
AROM Exercises:  *Complete exercises _10-15_____ times each, ___3____ times per day*  1) Wrist Flexion  Start with wrist at edge of table, palm facing up. With wrist hanging slightly off table, curl wrist upward, and back down.      2) Wrist Extension  Start with wrist at edge of table, palm facing down. With wrist slightly off the edge of the table, curl wrist up and back down.      3) Radial Deviations  Start with forearm flat against a table, wrist hanging slightly off the edge, and palm facing the wall. Bending at the wrist only, and keeping palm facing the wall, bend wrist so fist is pointing towards the floor, back up to start position, and up towards the ceiling. Return to start.        4) WRIST PRONATION  Turn your forearm towards palm face down.  Keep your elbow bent and by the side of your  Body.      5) WRIST SUPINATION  Turn your forearm towards palm face up.  Keep your elbow bent and by the side of your  Body.      Complete each exercise 10-15X, 2-3X/day  1) Towel crunch Place a small towel on a firm table top. Flatten out the towel and then place your hand on one end of it.  Next, flex your fingers 2-5 (index finger through pinky finger) as you pull the towel towards your hand.    2) Digit composite flexion/adduction (make a fist) Hold your hand up as shown. Open and close your hand into a fist and repeat. If you cannot make a full fist, then make a partial fist.    3) Thumb/finger opposition Touch the tip of the thumb to each fingertip one by one. Extend fingers fully after they are touched.      4) Finger Taps Start with the hand flat and fingers slightly spread.  One at a time, starting with the thumb, lift each finger up separately.       5) Digit Abduction/Adduction Hold hand palm down flat on table. Spread your fingers apart and back together.

## 2022-07-19 ENCOUNTER — Encounter (HOSPITAL_COMMUNITY): Payer: Self-pay | Admitting: Occupational Therapy

## 2022-07-19 ENCOUNTER — Ambulatory Visit (HOSPITAL_COMMUNITY): Payer: Medicaid Other | Admitting: Occupational Therapy

## 2022-07-19 DIAGNOSIS — M25632 Stiffness of left wrist, not elsewhere classified: Secondary | ICD-10-CM

## 2022-07-19 DIAGNOSIS — R29898 Other symptoms and signs involving the musculoskeletal system: Secondary | ICD-10-CM

## 2022-07-19 DIAGNOSIS — M79632 Pain in left forearm: Secondary | ICD-10-CM | POA: Diagnosis not present
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
## 2022-07-24 ENCOUNTER — Ambulatory Visit (HOSPITAL_COMMUNITY): Payer: Medicaid Other | Admitting: Occupational Therapy

## 2022-07-24 ENCOUNTER — Encounter (HOSPITAL_COMMUNITY): Payer: Self-pay | Admitting: Occupational Therapy

## 2022-07-24 DIAGNOSIS — R29898 Other symptoms and signs involving the musculoskeletal system: Secondary | ICD-10-CM

## 2022-07-24 DIAGNOSIS — M79632 Pain in left forearm: Secondary | ICD-10-CM

## 2022-07-24 DIAGNOSIS — M25632 Stiffness of left wrist, not elsewhere classified: Secondary | ICD-10-CM

## 2022-07-24 NOTE — Patient Instructions (Signed)
Home Exercises Program Theraputty Exercises  Do the following exercises 2-3 times a day using your affected hand.  1. Roll putty into a ball.  2. Make into a pancake.  3. Roll putty into a roll.  4. Pinch along log with first finger and thumb.   5. Make into a ball.  6. Roll it back into a log.   7. Pinch using thumb and side of first finger.  8. Roll into a ball, then flatten into a pancake.  9. Using your fingers, make putty into a mountain.  10. Roll putty back into a ball and squeeze gently for 2-3 minutes.    AROM Exercises   1) Wrist Flexion  Start with wrist at edge of table, palm facing up. With wrist hanging slightly off table, curl wrist upward, and back down.      2) Wrist Extension  Start with wrist at edge of table, palm facing down. With wrist slightly off the edge of the table, curl wrist up and back down.      3) Radial Deviations  Start with forearm flat against a table, wrist hanging slightly off the edge, and palm facing the wall. Bending at the wrist only, and keeping palm facing the wall, bend wrist so fist is pointing towards the floor, back up to start position, and up towards the ceiling. Return to start.        4) WRIST PRONATION  Turn your forearm towards palm face down.  Keep your elbow bent and by the side of your  Body.      5) WRIST SUPINATION  Turn your forearm towards palm face up.  Keep your elbow bent and by the side of your  Body.      *Complete exercises ______ times each, _______ times per day*
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
## 2022-07-26 ENCOUNTER — Encounter (HOSPITAL_COMMUNITY): Payer: Medicaid Other | Admitting: Occupational Therapy

## 2022-07-30 ENCOUNTER — Encounter (HOSPITAL_COMMUNITY): Payer: Self-pay | Admitting: Occupational Therapy

## 2022-07-30 ENCOUNTER — Ambulatory Visit (HOSPITAL_COMMUNITY): Payer: Medicaid Other | Admitting: Occupational Therapy

## 2022-07-30 DIAGNOSIS — R29898 Other symptoms and signs involving the musculoskeletal system: Secondary | ICD-10-CM

## 2022-07-30 DIAGNOSIS — M79632 Pain in left forearm: Secondary | ICD-10-CM | POA: Diagnosis not present

## 2022-07-30 DIAGNOSIS — M25632 Stiffness of left wrist, not elsewhere classified: Secondary | ICD-10-CM
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
## 2022-07-30 NOTE — Patient Instructions (Signed)
Strengthening Exercises  1) WRIST EXTENSION CURLS - TABLE  Hold a small free weight, rest your forearm on a table and bend your wrist up and down with your palm face down as shown.      2) WRIST FLEXION CURLS - TABLE  Hold a small free weight, rest your forearm on a table and bend your wrist up and down with your palm face up as shown.     3) FREE WEIGHT RADIAL/ULNAR DEVIATION - TABLE  Hold a small free weight, rest your forearm on a table and bend your wrist up and down with your palm facing towards the side as shown.     4) Pronation  Forearm supported on table with wrist in neutral position. Using a weight, roll wrist so that palm faces downward. Hold for 2 seconds and return to starting position.     5) Supination  Forearm supported on table with wrist in neutral position. Using a weight, roll wrist so that palm is now facing upward. Hold for 2 seconds and return to starting position.      *Complete exercises using red theraband,  10-15 times each, 2-3 times per day*

## 2022-08-01 ENCOUNTER — Ambulatory Visit (HOSPITAL_COMMUNITY): Payer: Medicaid Other | Admitting: Occupational Therapy

## 2022-08-01 DIAGNOSIS — M79632 Pain in left forearm: Secondary | ICD-10-CM | POA: Diagnosis not present

## 2022-08-01 DIAGNOSIS — R29898 Other symptoms and signs involving the musculoskeletal system: Secondary | ICD-10-CM

## 2022-08-01 DIAGNOSIS — M25632 Stiffness of left wrist, not elsewhere classified: Secondary | ICD-10-CM
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
## 2022-08-02 ENCOUNTER — Encounter (HOSPITAL_COMMUNITY): Payer: Self-pay | Admitting: Occupational Therapy

## 2022-08-06 ENCOUNTER — Encounter (HOSPITAL_COMMUNITY): Payer: Self-pay | Admitting: Occupational Therapy

## 2022-08-06 ENCOUNTER — Ambulatory Visit (HOSPITAL_COMMUNITY): Payer: Medicaid Other | Attending: Orthopedic Surgery | Admitting: Occupational Therapy

## 2022-08-06 DIAGNOSIS — X58XXXA Exposure to other specified factors, initial encounter: Secondary | ICD-10-CM | POA: Diagnosis not present

## 2022-08-06 DIAGNOSIS — M25632 Stiffness of left wrist, not elsewhere classified: Secondary | ICD-10-CM | POA: Insufficient documentation

## 2022-08-06 DIAGNOSIS — M79632 Pain in left forearm: Secondary | ICD-10-CM | POA: Insufficient documentation

## 2022-08-06 DIAGNOSIS — S52692A Other fracture of lower end of left ulna, initial encounter for closed fracture: Secondary | ICD-10-CM | POA: Diagnosis not present

## 2022-08-06 DIAGNOSIS — R29898 Other symptoms and signs involving the musculoskeletal system: Secondary | ICD-10-CM | POA: Diagnosis present
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
## 2022-08-08 ENCOUNTER — Ambulatory Visit (HOSPITAL_COMMUNITY): Payer: Medicaid Other | Admitting: Occupational Therapy

## 2022-08-08 DIAGNOSIS — M25632 Stiffness of left wrist, not elsewhere classified: Secondary | ICD-10-CM

## 2022-08-08 DIAGNOSIS — S52692A Other fracture of lower end of left ulna, initial encounter for closed fracture: Secondary | ICD-10-CM | POA: Diagnosis not present

## 2022-08-08 DIAGNOSIS — M79632 Pain in left forearm: Secondary | ICD-10-CM

## 2022-08-08 DIAGNOSIS — R29898 Other symptoms and signs involving the musculoskeletal system: Secondary | ICD-10-CM
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
## 2022-08-09 ENCOUNTER — Encounter (HOSPITAL_COMMUNITY): Payer: Self-pay | Admitting: Occupational Therapy

## 2022-08-13 ENCOUNTER — Ambulatory Visit (HOSPITAL_COMMUNITY): Payer: Medicaid Other | Admitting: Occupational Therapy

## 2022-08-13 DIAGNOSIS — S52692A Other fracture of lower end of left ulna, initial encounter for closed fracture: Secondary | ICD-10-CM | POA: Diagnosis not present

## 2022-08-13 DIAGNOSIS — M79632 Pain in left forearm: Secondary | ICD-10-CM

## 2022-08-13 DIAGNOSIS — R29898 Other symptoms and signs involving the musculoskeletal system: Secondary | ICD-10-CM

## 2022-08-13 DIAGNOSIS — M25632 Stiffness of left wrist, not elsewhere classified: Secondary | ICD-10-CM
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
## 2022-08-14 ENCOUNTER — Encounter (HOSPITAL_COMMUNITY): Payer: Self-pay | Admitting: Occupational Therapy

## 2022-08-15 ENCOUNTER — Ambulatory Visit (HOSPITAL_COMMUNITY): Payer: Medicaid Other | Admitting: Occupational Therapy

## 2022-08-15 DIAGNOSIS — M79632 Pain in left forearm: Secondary | ICD-10-CM

## 2022-08-15 DIAGNOSIS — R29898 Other symptoms and signs involving the musculoskeletal system: Secondary | ICD-10-CM

## 2022-08-15 DIAGNOSIS — M25632 Stiffness of left wrist, not elsewhere classified: Secondary | ICD-10-CM

## 2022-08-15 DIAGNOSIS — S52692A Other fracture of lower end of left ulna, initial encounter for closed fracture: Secondary | ICD-10-CM | POA: Diagnosis not present
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
## 2022-08-16 ENCOUNTER — Encounter (HOSPITAL_COMMUNITY): Payer: Self-pay | Admitting: Occupational Therapy

## 2022-08-20 ENCOUNTER — Encounter (HOSPITAL_COMMUNITY): Payer: Medicaid Other | Admitting: Occupational Therapy

## 2022-08-22 ENCOUNTER — Encounter (HOSPITAL_COMMUNITY): Payer: Medicaid Other | Admitting: Occupational Therapy

## 2022-08-27 ENCOUNTER — Telehealth (HOSPITAL_COMMUNITY): Payer: Self-pay | Admitting: Occupational Therapy

## 2022-08-27 ENCOUNTER — Encounter (HOSPITAL_COMMUNITY): Payer: Medicaid Other | Admitting: Occupational Therapy

## 2022-08-27 NOTE — Telephone Encounter (Signed)
Attempted to call pt regarding no show for 10/19 and 10/24. Contact number is not in service. Called sister who is listed in the chart, states this is the correct number, she will let pt know we are trying to contact him. Also sending MyChart message to the patient. Due to attendance policy, pt will now only be able to schedule 1 appt at a time. Cancelling all appts with exception of next appt on 10/26. If pt is a no-show for this appt he will discharged and need a new referral to return for therapy services.   Guadelupe Sabin, OTR/L  [phone] 08/27/22

## 2022-08-29 ENCOUNTER — Encounter (HOSPITAL_COMMUNITY): Payer: Self-pay | Admitting: Occupational Therapy

## 2022-08-29 ENCOUNTER — Encounter (HOSPITAL_COMMUNITY): Payer: Medicaid Other | Admitting: Occupational Therapy

## 2022-08-29 NOTE — Therapy (Unsigned)
[place], [place], [address] Phone: [phone]   Fax:  [phone]  Patient Details  Name: Russell Bell MRN: [id number] Date of Birth: 30-Jul-1981 Referring Provider: Sherilyn Cooter, MD  Encounter Date: 08/29/2022  OCCUPATIONAL THERAPY DISCHARGE SUMMARY  Visits from Start of Care: 9  Current functional level related to goals / functional outcomes: Patient has met 1 of 8 goals, which was that he was fitted for an immobilization splint and educated on a wearing schedule. At this point in therapy, he no longer requires the splint.  Patient has partially met 2 of 8 goals, including providing and educating on a comprehensive HEP and decreasing average pain to 3/10 or less.    Remaining deficits: Patient continues to have significant edema and fascial restrictions which are decreasing ROM. Additionally, pt continues to be under 5lb lifting restrictions which limited ability to assess and improve overall strength and grip/pinch strength.    Education / Equipment: Pt was educated on and provided an HEP for improving ROM and strength, as well as strategies from improving independence with ADL's.    Plan: Patient is being discharged from therapy due to attendance policy and 3 consecutive no shows.      Khala Tarte Isabelle Course, OT 08/29/2022, 10:51 AM  [place], [place], [address] Phone: [phone]   Fax:  [phone]

## 2022-09-03 ENCOUNTER — Encounter (HOSPITAL_COMMUNITY): Payer: Medicaid Other | Admitting: Occupational Therapy

## 2022-09-05 ENCOUNTER — Encounter (HOSPITAL_COMMUNITY): Payer: Medicaid Other | Admitting: Occupational Therapy
